# Patient Record
Sex: Female | Born: 1975 | Race: White | Hispanic: No | Marital: Married | Smoking: Never smoker
Health system: Southern US, Community
[De-identification: ages and names within clinical notes are randomized; demographics above are authoritative.]

## PROBLEM LIST (undated history)

## (undated) DIAGNOSIS — L409 Psoriasis, unspecified: Secondary | ICD-10-CM

## (undated) DIAGNOSIS — I10 Essential (primary) hypertension: Secondary | ICD-10-CM

## (undated) DIAGNOSIS — D219 Benign neoplasm of connective and other soft tissue, unspecified: Secondary | ICD-10-CM

## (undated) DIAGNOSIS — R87612 Low grade squamous intraepithelial lesion on cytologic smear of cervix (LGSIL): Secondary | ICD-10-CM

## (undated) DIAGNOSIS — E039 Hypothyroidism, unspecified: Secondary | ICD-10-CM

## (undated) HISTORY — DX: Benign neoplasm of connective and other soft tissue, unspecified: D21.9

## (undated) HISTORY — DX: Hypothyroidism, unspecified: E03.9

## (undated) HISTORY — DX: Low grade squamous intraepithelial lesion on cytologic smear of cervix (LGSIL): R87.612

## (undated) HISTORY — DX: Psoriasis, unspecified: L40.9

## (undated) HISTORY — PX: MYOMECTOMY: SHX85

## (undated) HISTORY — PX: AUGMENTATION MAMMAPLASTY: SUR837

## (undated) HISTORY — DX: Essential (primary) hypertension: I10

## (undated) HISTORY — PX: TONSILLECTOMY: SUR1361

## (undated) HISTORY — PX: COLPOSCOPY: SHX161

## (undated) HISTORY — PX: LASIK: SHX215

---

## 2008-04-03 ENCOUNTER — Ambulatory Visit: Payer: Self-pay

## 2008-04-03 ENCOUNTER — Ambulatory Visit: Payer: Self-pay | Admitting: Unknown Physician Specialty

## 2008-04-04 ENCOUNTER — Ambulatory Visit: Payer: Self-pay | Admitting: Unknown Physician Specialty

## 2008-04-06 ENCOUNTER — Ambulatory Visit: Payer: Self-pay | Admitting: Unknown Physician Specialty

## 2008-04-09 ENCOUNTER — Ambulatory Visit: Payer: Self-pay | Admitting: Unknown Physician Specialty

## 2008-04-12 ENCOUNTER — Ambulatory Visit: Payer: Self-pay | Admitting: Obstetrics and Gynecology

## 2008-04-15 ENCOUNTER — Ambulatory Visit: Payer: Self-pay | Admitting: Obstetrics and Gynecology

## 2009-05-20 ENCOUNTER — Encounter: Payer: Self-pay | Admitting: Obstetrics & Gynecology

## 2009-08-02 ENCOUNTER — Encounter: Payer: Self-pay | Admitting: Obstetrics & Gynecology

## 2009-08-05 ENCOUNTER — Observation Stay: Payer: Self-pay

## 2009-08-09 ENCOUNTER — Encounter: Payer: Self-pay | Admitting: Maternal and Fetal Medicine

## 2009-08-16 ENCOUNTER — Encounter: Payer: Self-pay | Admitting: Obstetrics & Gynecology

## 2009-08-23 ENCOUNTER — Observation Stay: Payer: Self-pay | Admitting: Obstetrics and Gynecology

## 2009-08-26 ENCOUNTER — Observation Stay: Payer: Self-pay

## 2009-08-30 ENCOUNTER — Encounter: Payer: Self-pay | Admitting: Maternal and Fetal Medicine

## 2009-09-06 ENCOUNTER — Encounter: Payer: Self-pay | Admitting: Obstetrics and Gynecology

## 2009-09-07 ENCOUNTER — Ambulatory Visit: Payer: Self-pay

## 2009-09-09 ENCOUNTER — Observation Stay: Payer: Self-pay

## 2009-09-13 ENCOUNTER — Encounter: Payer: Self-pay | Admitting: Maternal & Fetal Medicine

## 2009-09-15 ENCOUNTER — Observation Stay: Payer: Self-pay

## 2009-09-15 ENCOUNTER — Ambulatory Visit: Payer: Self-pay | Admitting: Unknown Physician Specialty

## 2009-09-16 ENCOUNTER — Inpatient Hospital Stay: Payer: Self-pay

## 2011-07-19 ENCOUNTER — Ambulatory Visit: Payer: Self-pay | Admitting: Obstetrics & Gynecology

## 2011-07-19 LAB — CBC WITH DIFFERENTIAL/PLATELET
Basophil #: 0 10*3/uL (ref 0.0–0.1)
Basophil %: 0.1 %
Eosinophil %: 0.5 %
HCT: 33.3 % — ABNORMAL LOW (ref 35.0–47.0)
HGB: 11.2 g/dL — ABNORMAL LOW (ref 12.0–16.0)
Lymphocyte #: 1.9 10*3/uL (ref 1.0–3.6)
Lymphocyte %: 22.5 %
MCH: 30.6 pg (ref 26.0–34.0)
Monocyte %: 10.4 %
Neutrophil #: 5.5 10*3/uL (ref 1.4–6.5)
Neutrophil %: 66.5 %
Platelet: 237 10*3/uL (ref 150–440)
RDW: 13.7 % (ref 11.5–14.5)
WBC: 8.3 10*3/uL (ref 3.6–11.0)

## 2011-07-20 ENCOUNTER — Inpatient Hospital Stay: Payer: Self-pay | Admitting: Obstetrics & Gynecology

## 2011-07-21 LAB — HEMATOCRIT: HCT: 28.1 % — ABNORMAL LOW (ref 35.0–47.0)

## 2012-08-01 ENCOUNTER — Ambulatory Visit: Payer: Self-pay | Admitting: Otolaryngology

## 2012-08-02 LAB — PATHOLOGY REPORT

## 2014-07-17 NOTE — Op Note (Signed)
PATIENT NAME:  Cassandra Rice, Cassandra Rice MR#:  [id number] DATE OF BIRTH:  [birth year]  DATE OF PROCEDURE:  08/01/2012  SURGEON:  Janalee Dane, MD  PREOPERATIVE DIAGNOSIS: Chronic tonsillitis.   POSTOPERATIVE DIAGNOSIS: Chronic tonsillitis.   PROCEDURE: Tonsillectomy.   ANESTHESIA:  General endotracheal. Total amount of local: 4 mL.  FINDINGS: The tonsils were 2+, chronically and cryptically inflamed.   OPERATIVE FINDINGS:  Large tonsils.  DESCRIPTION OF THE PROCEDURE:  The patient was identified in the holding area and taken to the operating room and placed in the supine position.  After general endotracheal anesthesia, the table was turned 45 degrees and the patient was draped in the usual fashion for a tonsillectomy.  A mouth gag was inserted into the oral cavity and examination of the oropharynx showed the uvula was non-bifid.  There was no evidence of submucous cleft to the palate.  There were large tonsils.  Beginning on the left-hand side a tenaculum was used to grasp the tonsil and the Bovie cautery was used to dissect it free from the fossa.  In a similar fashion, the right tonsil was removed.  Meticulous hemostasis was achieved using the Bovie cautery.  With both tonsils removed and no active bleeding, 0.5% plain Marcaine was used to inject the anterior and posterior tonsillar pillars bilaterally.  A total of 4 mL was used.  The patient tolerated the procedure well and was awakened in the operating room and taken to the recovery room in stable condition.   CULTURES:  None.  SPECIMENS:  Tonsils.  ESTIMATED BLOOD LOSS:  Less than 10 mL.      ____________________________ J. Nadeen Landau, MD jmc:dm D: 08/01/2012 07:43:06 ET T: 08/01/2012 09:03:05 ET JOB#: [id number]  cc: Janalee Dane, MD, <Dictator> Nicholos Johns MD ELECTRONICALLY SIGNED 08/13/2012 6:22

## 2014-07-19 NOTE — Op Note (Signed)
PATIENT NAME:  Cassandra Rice, Cassandra Rice MR#:  [id number] DATE OF BIRTH:  June 23, 1975  DATE OF PROCEDURE:  07/20/2011  PREOPERATIVE DIAGNOSIS: Term intrauterine pregnancy, prior history of cesarean section.   POSTOPERATIVE DIAGNOSIS: Term intrauterine pregnancy, prior history of cesarean section.   PROCEDURE: Low transverse cesarean section.   SURGEON: Barnett Applebaum, M.D.   Terrence DupontCharleston Poot, M.D.   ANESTHESIA: Spinal.   ESTIMATED BLOOD LOSS: 250 mL.   COMPLICATIONS: None.   FINDINGS: Normal tubes and ovaries. Uterus is normal other than a thin lower uterine segment. The patient delivered a viable female infant weighing 6 pounds, 12 ounces with Apgar scores of 9 and 9 at one and five minutes, respectively.   DISPOSITION: To the recovery room in stable condition.   TECHNIQUE: The patient is prepped and draped in the usual sterile fashion after adequate anesthesia is obtained in the supine position on the operating table. Scar is noted and a scalpel is used to create a low transverse skin incision down to the level of the rectus fascia. The rectus fascia is dissected bilaterally using Mayo scissors and the rectus muscles are separated in the midline. The peritoneum is penetrated and the bladder is inferiorly dissected and retracted. Scalpel is used to create a low transverse hysterotomy incision that is then extended by blunt dissection and then amniotomy reveals clear fluid. The infant's head is grasped and delivered easily without the use of a vacuum device. The oropharynx is suctioned. Nuchal cord is reduced and the remaining infant is delivered.   Cord blood is obtained and the placenta is manually extracted. The uterus is externalized and cleansed of all membranes and debris using a moist sponge. The hysterotomy incision is closed with a running #1 Vicryl suture in a locking fashion followed by a second layer to imbricate the first layer with excellent hemostasis noted. The uterus is then placed  back in the intraabdominal cavity and the paracolic gutters are irrigated using warm saline. Re-examination of the incision reveals excellent hemostasis.   The peritoneum is closed with a Vicryl suture. The On-Q pain pump is placed with trocars placed through the abdomen into the subfascial space and then the Silver soaker catheters are fed through these trocars into place. The rectus fascia is then closed with a 0 Maxon suture. The subcutaneous tissues are irrigated and hemostasis is assured using electrocautery. Skin is then closed with 4-0 Vicryl suture in a subcuticular fashion followed by placement of Dermabond to help close the incision as well as to secure the catheters in place. The catheters are infiltrated with bupivacaine 0.5% through each catheter and then Steri-Strips are used to stabilize them in place. The patient goes to the recovery room in stable condition. All sponge, instrument, and needle counts are correct.   ____________________________ R. Barnett Applebaum, MD rph:bjt D: 07/20/2011 08:21:03 ET T: 07/20/2011 10:42:36 ET JOB#: [id number]  cc: Glean Salen, MD, <Dictator> Gae Dry MD ELECTRONICALLY SIGNED 07/28/2011 10:28

## 2017-06-28 ENCOUNTER — Ambulatory Visit (INDEPENDENT_AMBULATORY_CARE_PROVIDER_SITE_OTHER): Payer: Managed Care, Other (non HMO) | Admitting: Obstetrics & Gynecology

## 2017-06-28 ENCOUNTER — Telehealth: Payer: Self-pay | Admitting: Obstetrics & Gynecology

## 2017-06-28 ENCOUNTER — Encounter: Payer: Self-pay | Admitting: Obstetrics & Gynecology

## 2017-06-28 VITALS — BP 110/60 | Ht 64.0 in | Wt 109.0 lb

## 2017-06-28 DIAGNOSIS — Z1231 Encounter for screening mammogram for malignant neoplasm of breast: Secondary | ICD-10-CM | POA: Diagnosis not present

## 2017-06-28 DIAGNOSIS — Z0000 Encounter for general adult medical examination without abnormal findings: Secondary | ICD-10-CM | POA: Diagnosis not present

## 2017-06-28 DIAGNOSIS — Z1239 Encounter for other screening for malignant neoplasm of breast: Secondary | ICD-10-CM

## 2017-06-28 DIAGNOSIS — Z1211 Encounter for screening for malignant neoplasm of colon: Secondary | ICD-10-CM

## 2017-06-28 DIAGNOSIS — Z124 Encounter for screening for malignant neoplasm of cervix: Secondary | ICD-10-CM | POA: Diagnosis not present

## 2017-06-28 NOTE — Progress Notes (Signed)
HPI:      Cassandra Rice is a 42 y.o. [id number] who LMP was Patient's last menstrual period was 06/01/2017., she presents today for her annual examination. The patient has no complaints today. The patient is sexually active. Her last pap: approximate date 2017 and was normal and last mammogram: approximate date 2014 (baseline) and was normal. The patient does perform self breast exams.  There is notable family history of breast or ovarian cancer in her family.  The patient has regular exercise: yes.  The patient denies current symptoms of depression.    GYN History: Contraception: IUD, 5 years, recent BTB (prior no bleeding for 5 years)  PMHx: Past Medical History:  Diagnosis Date  . Fibroids   . Hypertension   . Hypothyroidism   . LGSIL on Pap smear of cervix   . Psoriasis    Past Surgical History:  Procedure Laterality Date  . COLPOSCOPY    . LASIK    . MYOMECTOMY    . TONSILLECTOMY     Family History  Problem Relation Age of Onset  . Arthritis Mother   . Colon cancer Mother   . Hypertension Mother   . Skin cancer Mother   . Alcoholism Father   . Hypertension Father   . Breast cancer Maternal Aunt    Social History   Tobacco Use  . Smoking status: Never Smoker  . Smokeless tobacco: Never Used  Substance Use Topics  . Alcohol use: Yes  . Drug use: Never    Current Outpatient Medications:  .  OTEZLA 30 MG TABS, Take 1 tablet by mouth 2 (two) times daily., Disp: , Rfl: 11 .  SYNTHROID 50 MCG tablet, , Disp: , Rfl:  Allergies: Patient has no known allergies.  Review of Systems  Constitutional: Negative for chills, fever and malaise/fatigue.  HENT: Negative for congestion, sinus pain and sore throat.   Eyes: Negative for blurred vision and pain.  Respiratory: Negative for cough and wheezing.   Cardiovascular: Negative for chest pain and leg swelling.  Gastrointestinal: Negative for abdominal pain, constipation, diarrhea, heartburn, nausea and vomiting.    Genitourinary: Negative for dysuria, frequency, hematuria and urgency.  Musculoskeletal: Negative for back pain, joint pain, myalgias and neck pain.  Skin: Negative for itching and rash.  Neurological: Negative for dizziness, tremors and weakness.  Endo/Heme/Allergies: Does not bruise/bleed easily.  Psychiatric/Behavioral: Negative for depression. The patient is not nervous/anxious and does not have insomnia.    Objective: BP 110/60   Ht 5\' 4"  (1.626 m)   Wt 109 lb (49.4 kg)   LMP 06/01/2017   BMI 18.71 kg/m   Filed Weights   06/28/17 1002  Weight: 109 lb (49.4 kg)   Body mass index is 18.71 kg/m. Physical Exam  Constitutional: She is oriented to person, place, and time. She appears well-developed and well-nourished. No distress.  Genitourinary: Rectum normal, vagina normal and uterus normal. Pelvic exam was performed with patient supine. There is no rash or lesion on the right labia. There is no rash or lesion on the left labia. Vagina exhibits no lesion. No bleeding in the vagina. Right adnexum does not display mass and does not display tenderness. Left adnexum does not display mass and does not display tenderness. Cervix does not exhibit motion tenderness, lesion, friability or polyp.   Uterus is mobile and midaxial. Uterus is not enlarged or exhibiting a mass.  Genitourinary Comments: IUD strings 2 cm  HENT:  Head: Normocephalic and atraumatic. Head is  without laceration.  Right Ear: Hearing normal.  Left Ear: Hearing normal.  Nose: No epistaxis.  No foreign bodies.  Mouth/Throat: Uvula is midline, oropharynx is clear and moist and mucous membranes are normal.  Eyes: Pupils are equal, round, and reactive to light.  Neck: Normal range of motion. Neck supple. No thyromegaly present.  Cardiovascular: Normal rate and regular rhythm. Exam reveals no gallop and no friction rub.  No murmur heard. Pulmonary/Chest: Effort normal and breath sounds normal. No respiratory distress. She  has no wheezes. Right breast exhibits no mass, no skin change and no tenderness. Left breast exhibits no mass, no skin change and no tenderness.  Abdominal: Soft. Bowel sounds are normal. She exhibits no distension. There is no tenderness. There is no rebound.  Musculoskeletal: Normal range of motion.  Neurological: She is alert and oriented to person, place, and time. No cranial nerve deficit.  Skin: Skin is warm and dry.  Psychiatric: She has a normal mood and affect. Judgment normal.  Vitals reviewed.  Assessment:  ANNUAL EXAM 1. Annual physical exam   2. Screen for colon cancer   3. Screening for cervical cancer   4. Screening for breast cancer    Screening Plan:            1.  Cervical Screening-  Pap smear done today  2. Breast screening- Exam annually and mammogram>40 planned   3. Colonoscopy every 5 - 10 years based on FH  4. Labs managed by PCP  5. Counseling for contraception: Need for IUD exchange (has been 5 years).  Likes Mirena as has no periods and needs BC.    F/U  Return in about 4 days (around 07/02/2017) for IUD EXCHANGE.  Barnett Applebaum, MD, [facility], [facility] 06/28/2017  10:48 AM

## 2017-06-28 NOTE — Patient Instructions (Signed)
PAP every year Mammogram every year    Call [phone] to schedule at [facility] Colonoscopy every 5 or 10 years Labs yearly (with PCP)

## 2017-06-28 NOTE — Telephone Encounter (Signed)
Patient scheduled 4/10 for Mirena insert with [facility]

## 2017-06-29 LAB — PAP IG (IMAGE GUIDED): PAP Smear Comment: 0

## 2017-06-29 NOTE — Telephone Encounter (Signed)
Mirena reserved for this patient. 

## 2017-07-04 ENCOUNTER — Encounter: Payer: Self-pay | Admitting: Obstetrics & Gynecology

## 2017-07-04 ENCOUNTER — Ambulatory Visit (INDEPENDENT_AMBULATORY_CARE_PROVIDER_SITE_OTHER): Payer: Managed Care, Other (non HMO) | Admitting: Obstetrics & Gynecology

## 2017-07-04 VITALS — BP 100/60 | Ht 64.0 in | Wt 109.0 lb

## 2017-07-04 DIAGNOSIS — Z30433 Encounter for removal and reinsertion of intrauterine contraceptive device: Secondary | ICD-10-CM

## 2017-07-04 NOTE — Progress Notes (Signed)
  History of Present Illness:  Cassandra Rice is a 42 y.o. that had a Mirena IUD placed approximately 5 years ago. Since that time, she states that she has done well w Mirena w min periods/bleeding and reassurance as to contraception.  The following portions of the patient's history were reviewed and updated as appropriate: allergies, current medications, past family history, past medical history, past social history, past surgical history and problem list.  There are no active problems to display for this patient.  Medications:  Current Outpatient Medications on File Prior to Visit  Medication Sig Dispense Refill  . levonorgestrel (MIRENA) 20 MCG/24HR IUD 1 each by Intrauterine route once.    Marland Kitchen OTEZLA 30 MG TABS Take 1 tablet by mouth 2 (two) times daily.  11  . SYNTHROID 50 MCG tablet      No current facility-administered medications on file prior to visit.    Allergies: has No Known Allergies.  Physical Exam:  BP 100/60   Ht 5\' 4"  (1.626 m)   Wt 109 lb (49.4 kg)   BMI 18.71 kg/m  Body mass index is 18.71 kg/m. Constitutional: Well nourished, well developed female in no acute distress.  Abdomen: diffusely non tender to palpation, non distended, and no masses, hernias Neuro: Grossly intact Psych:  Normal mood and affect.    Pelvic exam:  Two IUD strings present seen coming from the cervical os. EGBUS, vaginal vault and cervix: within normal limits  IUD Removal Strings of IUD identified and grasped.  IUD removed without problem.  Pt tolerated this well.  IUD noted to be intact.  IUD Insertion  Patient identified, informed consent performed, consent signed.   Discussed risks of irregular bleeding, cramping, infection, malpositioning or misplacement of the IUD outside the uterus which may require further procedure such as laparoscopy, risk of failure <1%. Time out was performed.  Urine pregnancy test negative.  A bimanual exam showed the uterus to be midposition.  Speculum placed  in the vagina.  Cervix visualized.  Cleaned with Betadine x 2.  Grasped anteriorly with a single tooth tenaculum.  Uterus sounded to 6 cm.   IUD placed per manufacturer's recommendations.  Strings trimmed to 3 cm. Tenaculum was removed, good hemostasis noted.  Patient tolerated procedure well.   Patient was given post-procedure instructions.  She was advised to have backup contraception for one week.  Patient was also asked to check IUD strings periodically and follow up in 4 weeks for IUD check.  Assessment: IUD Removal and Re-Insertion for Birth Control and Treatment of Menorrhagia  Plan: Monitor for side effects of IUD insertion F/U One Month She was amenable to this plan.  Barnett Applebaum, M.D. 07/04/2017 4:33 PM

## 2017-07-04 NOTE — Patient Instructions (Signed)

## 2017-07-10 ENCOUNTER — Telehealth: Payer: Self-pay

## 2017-07-10 ENCOUNTER — Other Ambulatory Visit: Payer: Self-pay

## 2017-07-10 DIAGNOSIS — Z1211 Encounter for screening for malignant neoplasm of colon: Secondary | ICD-10-CM

## 2017-07-10 NOTE — Telephone Encounter (Signed)
Gastroenterology Pre-Procedure Review  Request Date: 08/29/17 Requesting Physician: Dr. Bonna Gains  PATIENT REVIEW QUESTIONS: The patient responded to the following health history questions as indicated:    1. Are you having any GI issues? no 2. Do you have a personal history of Polyps? no 3. Do you have a family history of Colon Cancer or Polyps? yes (mother colon cancer and colon polyps) 4. Diabetes Mellitus? no 5. Joint replacements in the past 12 months?no 6. Major health problems in the past 3 months?no 7. Any artificial heart valves, MVP, or defibrillator?no    MEDICATIONS & ALLERGIES:    Patient reports the following regarding taking any anticoagulation/antiplatelet therapy:   Plavix, Coumadin, Eliquis, Xarelto, Lovenox, Pradaxa, Brilinta, or Effient? no Aspirin? no  Patient confirms/reports the following medications:  Current Outpatient Medications  Medication Sig Dispense Refill  . levonorgestrel (MIRENA) 20 MCG/24HR IUD 1 each by Intrauterine route once.    Marland Kitchen OTEZLA 30 MG TABS Take 1 tablet by mouth 2 (two) times daily.  11  . SYNTHROID 50 MCG tablet      No current facility-administered medications for this visit.     Patient confirms/reports the following allergies:  No Known Allergies  No orders of the defined types were placed in this encounter.   AUTHORIZATION INFORMATION Primary Insurance: 1D#: Group #:  Secondary Insurance: 1D#: Group #:  SCHEDULE INFORMATION: Date: 08/29/17 Time: Location:[facility]

## 2017-07-11 NOTE — Telephone Encounter (Signed)
Mirena received/charged 07/04/17

## 2017-08-01 ENCOUNTER — Encounter: Payer: Self-pay | Admitting: Obstetrics & Gynecology

## 2017-08-01 ENCOUNTER — Ambulatory Visit (INDEPENDENT_AMBULATORY_CARE_PROVIDER_SITE_OTHER): Payer: Managed Care, Other (non HMO) | Admitting: Obstetrics & Gynecology

## 2017-08-01 VITALS — BP 110/60 | Ht 64.0 in | Wt 110.0 lb

## 2017-08-01 DIAGNOSIS — Z30431 Encounter for routine checking of intrauterine contraceptive device: Secondary | ICD-10-CM

## 2017-08-01 NOTE — Progress Notes (Signed)
  History of Present Illness:  Cassandra Rice is a 42 y.o. that had a Mirena IUD placed approximately 4 weeks ago. Since that time, she states that she has had no pain but does have spotty bleeding most days (this happened at start of last new IUD placement as well).  PMHx: She  has a past medical history of Fibroids, Hypertension, Hypothyroidism, LGSIL on Pap smear of cervix, and Psoriasis. Also,  has a past surgical history that includes Colposcopy; LASIK; Myomectomy; and Tonsillectomy., family history includes Alcoholism in her father; Arthritis in her mother; Breast cancer in her maternal aunt; Colon cancer in her mother; Hypertension in her father and mother; Skin cancer in her mother.,  reports that she has never smoked. She has never used smokeless tobacco. She reports that she drinks alcohol. She reports that she does not use drugs. No outpatient medications have been marked as taking for the 08/01/17 encounter (Office Visit) with Gae Dry, MD.  .  Also, has No Known Allergies..  Review of Systems  All other systems reviewed and are negative.  Physical Exam:  BP 110/60   Ht 5\' 4"  (1.626 m)   Wt 110 lb (49.9 kg)   BMI 18.88 kg/m  Body mass index is 18.88 kg/m. Constitutional: Well nourished, well developed female in no acute distress.  Abdomen: diffusely non tender to palpation, non distended, and no masses, hernias Neuro: Grossly intact Psych:  Normal mood and affect.    Pelvic exam:  Two IUD strings present seen coming from the cervical os. EGBUS, vaginal vault and cervix: within normal limits  Assessment: IUD strings present in proper location; pt doing well  Plan: She was told to continue to use barrier contraception, in order to prevent any STIs, and to take a home pregnancy test or call us if she ever thinks she may be pregnant, and that her IUD expires in 5 years.  OCP to overlap and suppress BTB discussed and sample Rx given  She was amenable to this plan and we  will see her back in 1 year/PRN.  A total of 15 minutes were spent face-to-face with the patient during this encounter and over half of that time dealt with counseling and coordination of care.  Barnett Applebaum, MD, [facility], [facility] 08/01/2017  4:20 PM

## 2017-08-13 ENCOUNTER — Telehealth: Payer: Self-pay | Admitting: Gastroenterology

## 2017-08-13 NOTE — Telephone Encounter (Signed)
Patient Cassandra Rice that her insurance will not cover her colonoscopy at this time due to her age. She stated that her mother had cancerous polyps removed and was told her children would need a colonoscopy at an early age. Please call patient to get more information so this might be covered for her. She also would like to have her mothers history noted in her chart.

## 2017-08-16 DIAGNOSIS — Z9889 Other specified postprocedural states: Secondary | ICD-10-CM | POA: Insufficient documentation

## 2017-08-16 DIAGNOSIS — Z9089 Acquired absence of other organs: Secondary | ICD-10-CM | POA: Insufficient documentation

## 2017-08-16 DIAGNOSIS — Z800 Family history of malignant neoplasm of digestive organs: Secondary | ICD-10-CM | POA: Insufficient documentation

## 2017-08-16 DIAGNOSIS — Z803 Family history of malignant neoplasm of breast: Secondary | ICD-10-CM | POA: Insufficient documentation

## 2017-08-16 DIAGNOSIS — Z808 Family history of malignant neoplasm of other organs or systems: Secondary | ICD-10-CM | POA: Insufficient documentation

## 2017-08-16 NOTE — Telephone Encounter (Signed)
Contacted pt's insurance company.  I've been instructed to submit documentation for pre-determination.  Sending documents today.

## 2017-08-23 ENCOUNTER — Telehealth: Payer: Self-pay

## 2017-08-23 NOTE — Telephone Encounter (Signed)
Advised patient of insurance response to pre-determination request:  [employer] Call Ref # [id number] Determination: eligible for coverage Z80.0: family hx of colon cancer [phone] = colonoscopy

## 2017-08-29 ENCOUNTER — Encounter
Admission: RE | Disposition: A | Discharge status: Home / Self Care | Payer: Self-pay | Source: Ambulatory Visit | Attending: Gastroenterology

## 2017-08-29 ENCOUNTER — Ambulatory Visit: Payer: Managed Care, Other (non HMO) | Admitting: Certified Registered Nurse Anesthetist

## 2017-08-29 ENCOUNTER — Ambulatory Visit
Admission: RE | Admit: 2017-08-29 | Discharge: 2017-08-29 | Disposition: A | Discharge status: Home / Self Care | Payer: Managed Care, Other (non HMO) | Source: Ambulatory Visit | Attending: Gastroenterology | Admitting: Gastroenterology

## 2017-08-29 ENCOUNTER — Encounter: Payer: Self-pay | Admitting: Anesthesiology

## 2017-08-29 DIAGNOSIS — D125 Benign neoplasm of sigmoid colon: Secondary | ICD-10-CM | POA: Diagnosis not present

## 2017-08-29 DIAGNOSIS — Z79890 Hormone replacement therapy (postmenopausal): Secondary | ICD-10-CM | POA: Diagnosis not present

## 2017-08-29 DIAGNOSIS — K635 Polyp of colon: Secondary | ICD-10-CM

## 2017-08-29 DIAGNOSIS — E039 Hypothyroidism, unspecified: Secondary | ICD-10-CM | POA: Diagnosis not present

## 2017-08-29 DIAGNOSIS — K5730 Diverticulosis of large intestine without perforation or abscess without bleeding: Secondary | ICD-10-CM

## 2017-08-29 DIAGNOSIS — I10 Essential (primary) hypertension: Secondary | ICD-10-CM | POA: Diagnosis not present

## 2017-08-29 DIAGNOSIS — Z79899 Other long term (current) drug therapy: Secondary | ICD-10-CM | POA: Diagnosis not present

## 2017-08-29 DIAGNOSIS — Z975 Presence of (intrauterine) contraceptive device: Secondary | ICD-10-CM | POA: Insufficient documentation

## 2017-08-29 DIAGNOSIS — Z1211 Encounter for screening for malignant neoplasm of colon: Secondary | ICD-10-CM

## 2017-08-29 DIAGNOSIS — L409 Psoriasis, unspecified: Secondary | ICD-10-CM | POA: Insufficient documentation

## 2017-08-29 DIAGNOSIS — Z800 Family history of malignant neoplasm of digestive organs: Secondary | ICD-10-CM

## 2017-08-29 HISTORY — PX: COLONOSCOPY WITH PROPOFOL: SHX5780

## 2017-08-29 LAB — POCT PREGNANCY, URINE: Preg Test, Ur: NEGATIVE

## 2017-08-29 LAB — HM COLONOSCOPY

## 2017-08-29 SURGERY — COLONOSCOPY WITH PROPOFOL
Anesthesia: General

## 2017-08-29 MED ORDER — GLYCOPYRROLATE 0.2 MG/ML IJ SOLN
INTRAMUSCULAR | Status: AC
  Filled 2017-08-29: qty 1

## 2017-08-29 MED ORDER — PROPOFOL 500 MG/50ML IV EMUL
INTRAVENOUS | Status: AC
  Filled 2017-08-29: qty 50

## 2017-08-29 MED ORDER — MIDAZOLAM HCL 2 MG/2ML IJ SOLN
INTRAMUSCULAR | Status: AC
  Filled 2017-08-29: qty 2

## 2017-08-29 MED ORDER — MIDAZOLAM HCL 2 MG/2ML IJ SOLN
INTRAMUSCULAR | Status: DC | PRN
  Administered 2017-08-29: 2 mg via INTRAVENOUS

## 2017-08-29 MED ORDER — SUCCINYLCHOLINE CHLORIDE 20 MG/ML IJ SOLN
INTRAMUSCULAR | Status: AC
  Filled 2017-08-29: qty 1

## 2017-08-29 MED ORDER — LIDOCAINE HCL (PF) 1 % IJ SOLN: 2.0000 mL | Freq: Once | INTRAMUSCULAR | Status: DC

## 2017-08-29 MED ORDER — PROPOFOL 500 MG/50ML IV EMUL
INTRAVENOUS | Status: DC | PRN
  Administered 2017-08-29: 150 ug/kg/min via INTRAVENOUS

## 2017-08-29 MED ORDER — LIDOCAINE HCL (PF) 1 % IJ SOLN
INTRAMUSCULAR | Status: AC
  Filled 2017-08-29: qty 2

## 2017-08-29 MED ORDER — PROPOFOL 10 MG/ML IV BOLUS
INTRAVENOUS | Status: DC | PRN
  Administered 2017-08-29: 20 mg via INTRAVENOUS
  Administered 2017-08-29: 60 mg via INTRAVENOUS
  Administered 2017-08-29 (×6): 20 mg via INTRAVENOUS

## 2017-08-29 MED ORDER — SODIUM CHLORIDE 0.9 % IV SOLN
INTRAVENOUS | Status: DC
  Administered 2017-08-29: 08:00:00 via INTRAVENOUS

## 2017-08-29 NOTE — Anesthesia Procedure Notes (Signed)
Performed by: Amine Adelson, CRNA Pre-anesthesia Checklist: Emergency Drugs available, Patient identified, Suction available, Patient being monitored and Timeout performed Patient Re-evaluated:Patient Re-evaluated prior to induction Oxygen Delivery Method: Nasal cannula Induction Type: IV induction       

## 2017-08-29 NOTE — Anesthesia Postprocedure Evaluation (Signed)
Anesthesia Post Note  Patient: Cassandra Rice  Procedure(s) Performed: COLONOSCOPY WITH PROPOFOL (N/A )  Patient location during evaluation: Endoscopy Anesthesia Type: General Level of consciousness: awake and alert Pain management: pain level controlled Vital Signs Assessment: post-procedure vital signs reviewed and stable Respiratory status: spontaneous breathing, nonlabored ventilation, respiratory function stable and patient connected to nasal cannula oxygen Cardiovascular status: blood pressure returned to baseline and stable Postop Assessment: no apparent nausea or vomiting Anesthetic complications: no     Last Vitals:  Vitals:   08/29/17 0910 08/29/17 0920  BP: (!) 93/46 108/89  Pulse: (!) 53 60  Resp: 19 13  Temp:    SpO2: 100% 100%    Last Pain:  Vitals:   08/29/17 0920  TempSrc:   PainSc: 0-No pain                 Martha Clan

## 2017-08-29 NOTE — H&P (Signed)
Vonda Antigua, MD [address] [place], [place], [place], [address] [address], [place], [place], [place], [address] Phone: [phone]  Fax: [phone]  Primary Care Physician:  Gae Dry, MD   Pre-Procedure History & Physical: HPI:  Cassandra Rice is a 42 y.o. female is here for a colonoscopy.   Past Medical History:  Diagnosis Date  . Fibroids   . Hypertension   . Hypothyroidism   . LGSIL on Pap smear of cervix   . Psoriasis     Past Surgical History:  Procedure Laterality Date  . COLPOSCOPY    . LASIK    . MYOMECTOMY    . TONSILLECTOMY      Prior to Admission medications   Medication Sig Start Date End Date Taking? Authorizing Provider  levonorgestrel (MIRENA) 20 MCG/24HR IUD 1 each by Intrauterine route once.    [provider]  OTEZLA 30 MG TABS Take 1 tablet by mouth 2 (two) times daily. 05/31/17   [provider]  SYNTHROID 50 MCG tablet  06/19/17   [provider]    Allergies as of 07/10/2017  . (No Known Allergies)    Family History  Problem Relation Age of Onset  . Arthritis Mother   . Colon cancer Mother   . Hypertension Mother   . Skin cancer Mother   . Alcoholism Father   . Hypertension Father   . Breast cancer Maternal Aunt     Social History   Socioeconomic History  . Marital status: Married    Spouse name: Not on file  . Number of children: Not on file  . Years of education: Not on file  . Highest education level: Not on file  Occupational History  . Not on file  Social Needs  . Financial resource strain: Not on file  . Food insecurity:    Worry: Not on file    Inability: Not on file  . Transportation needs:    Medical: Not on file    Non-medical: Not on file  Tobacco Use  . Smoking status: Never Smoker  . Smokeless tobacco: Never Used  Substance and Sexual Activity  . Alcohol use: Yes  . Drug use: Never  . Sexual activity: Yes    Birth control/protection: IUD  Lifestyle  .  Physical activity:    Days per week: Not on file    Minutes per session: Not on file  . Stress: Not on file  Relationships  . Social connections:    Talks on phone: Not on file    Gets together: Not on file    Attends religious service: Not on file    Active member of club or organization: Not on file    Attends meetings of clubs or organizations: Not on file    Relationship status: Not on file  . Intimate partner violence:    Fear of current or ex partner: Not on file    Emotionally abused: Not on file    Physically abused: Not on file    Forced sexual activity: Not on file  Other Topics Concern  . Not on file  Social History Narrative  . Not on file    Review of Systems: See HPI, otherwise negative ROS  Physical Exam: BP 124/65   Pulse 70   Temp 97.8 F (36.6 C) (Tympanic)   Resp 17   Ht 5\' 4"  (1.626 m)   Wt 107 lb (48.5 kg)   SpO2 100%   BMI 18.37 kg/m  General:   Alert,  pleasant and cooperative in NAD Head:  Normocephalic and atraumatic. Neck:  Supple; no masses or thyromegaly. Lungs:  Clear throughout to auscultation, normal respiratory effort.    Heart:  +S1, +S2, Regular rate and rhythm, No edema. Abdomen:  Soft, nontender and nondistended. Normal bowel sounds, without guarding, and without rebound.   Neurologic:  Alert and  oriented x4;  grossly normal neurologically.  Impression/Plan: Cassandra Rice is here for a colonoscopy to be performed for high risk screening. Mother diagnosed with colon cancer in her 34s.   Risks, benefits, limitations, and alternatives regarding  colonoscopy have been reviewed with the patient.  Questions have been answered.  All parties agreeable.   Virgel Manifold, MD  08/29/2017, 8:07 AM

## 2017-08-29 NOTE — Anesthesia Preprocedure Evaluation (Signed)
Anesthesia Evaluation  Patient identified by MRN, date of birth, ID band Patient awake    Reviewed: Allergy & Precautions, H&P , NPO status , Patient's Chart, lab work & pertinent test results, reviewed documented beta blocker date and time   History of Anesthesia Complications (+) PONV and history of anesthetic complications  Airway Mallampati: I  TM Distance: >3 FB Neck ROM: full    Dental  (+) Dental Advidsory Given, Teeth Intact   Pulmonary neg pulmonary ROS,           Cardiovascular Exercise Tolerance: Good negative cardio ROS       Neuro/Psych negative neurological ROS  negative psych ROS   GI/Hepatic negative GI ROS, Neg liver ROS,   Endo/Other  neg diabetesHypothyroidism   Renal/GU negative Renal ROS  negative genitourinary   Musculoskeletal   Abdominal   Peds  Hematology negative hematology ROS (+)   Anesthesia Other Findings Past Medical History: No date: Fibroids No date: Hypertension No date: Hypothyroidism No date: LGSIL on Pap smear of cervix No date: Psoriasis   Reproductive/Obstetrics negative OB ROS                             Anesthesia Physical Anesthesia Plan  ASA: II  Anesthesia Plan: General   Post-op Pain Management:    Induction: Intravenous  PONV Risk Score and Plan: 3 and Propofol infusion  Airway Management Planned: Nasal Cannula  Additional Equipment:   Intra-op Plan:   Post-operative Plan:   Informed Consent: I have reviewed the patients History and Physical, chart, labs and discussed the procedure including the risks, benefits and alternatives for the proposed anesthesia with the patient or authorized representative who has indicated his/her understanding and acceptance.   Dental Advisory Given  Plan Discussed with: Anesthesiologist, CRNA and Surgeon  Anesthesia Plan Comments:         Anesthesia Quick Evaluation

## 2017-08-29 NOTE — Anesthesia Post-op Follow-up Note (Signed)
Anesthesia QCDR form completed.        

## 2017-08-29 NOTE — Transfer of Care (Signed)
Immediate Anesthesia Transfer of Care Note  Patient: Cassandra Rice  Procedure(s) Performed: COLONOSCOPY WITH PROPOFOL (N/A )  Patient Location: PACU  Anesthesia Type:General  Level of Consciousness: sedated  Airway & Oxygen Therapy: Patient Spontanous Breathing and Patient connected to nasal cannula oxygen  Post-op Assessment: Report given to RN and Post -op Vital signs reviewed and stable  Post vital signs: Reviewed and stable  Last Vitals:  Vitals Value Taken Time  BP    Temp    Pulse 64 08/29/2017  9:02 AM  Resp 18 08/29/2017  9:02 AM  SpO2 100 % 08/29/2017  9:02 AM  Vitals shown include unvalidated device data.  Last Pain:  Vitals:   08/29/17 0738  TempSrc: Tympanic         Complications: No apparent anesthesia complications

## 2017-08-29 NOTE — Op Note (Signed)
[facility] Gastroenterology Patient Name: Cassandra Rice Procedure Date: 08/29/2017 7:20 AM MRN: [id number] Account #: [id number] Date of Birth: 1975/12/15 Admit Type: Outpatient Age: 42 Room: [facility] ENDO ROOM 2 Gender: Female Note Status: Finalized Procedure:            Colonoscopy Indications:          Screening in patient at increased risk: Family history                        of 1st-degree relative with colorectal cancer before                        age 76 years Providers:            Ellery Tash B. Bonna Gains MD, MD Referring MD:         Gae Dry, MD (Referring MD) Medicines:            Monitored Anesthesia Care Complications:        No immediate complications. Procedure:            Pre-Anesthesia Assessment:                       - ASA Grade Assessment: II - A patient with mild                        systemic disease.                       - Prior to the procedure, a History and Physical was                        performed, and patient medications, allergies and                        sensitivities were reviewed. The patient's tolerance of                        previous anesthesia was reviewed.                       - The risks and benefits of the procedure and the                        sedation options and risks were discussed with the                        patient. All questions were answered and informed                        consent was obtained.                       - Patient identification and proposed procedure were                        verified prior to the procedure by the physician, the                        nurse, the anesthesiologist, the anesthetist and the  technician. The procedure was verified in the procedure                        room.                       After obtaining informed consent, the colonoscope was                        passed under direct vision. Throughout the procedure,   the patient's blood pressure, pulse, and oxygen                        saturations were monitored continuously. The                        Colonoscope was introduced through the anus and                        advanced to the the cecum, identified by appendiceal                        orifice and ileocecal valve. The colonoscopy was                        performed with ease. The patient tolerated the                        procedure well. The quality of the bowel preparation                        was good. Findings:      The perianal and digital rectal examinations were normal.      A 6 mm polyp was found in the sigmoid colon. The polyp was sessile. The       polyp was removed with a hot snare. Resection and retrieval were       complete.      A few diverticula were found in the sigmoid colon.      The exam was otherwise without abnormality.      The rectum, sigmoid colon, descending colon, transverse colon, ascending       colon and cecum appeared normal.      The retroflexed view of the distal rectum and anal verge was normal and       showed no anal or rectal abnormalities. Impression:           - One 6 mm polyp in the sigmoid colon, removed with a                        hot snare. Resected and retrieved.                       - Diverticulosis in the sigmoid colon.                       - The examination was otherwise normal.                       - The rectum, sigmoid colon, descending colon,  transverse colon, ascending colon and cecum are normal.                       - The distal rectum and anal verge are normal on                        retroflexion view. Recommendation:       - Discharge patient to home (with escort).                       - Advance diet as tolerated.                       - Continue present medications.                       - Await pathology results.                       - Repeat colonoscopy in 5 years for surveillance.                        - The findings and recommendations were discussed with                        the patient.                       - The findings and recommendations were discussed with                        the patient's family.                       - Return to primary care physician as previously                        scheduled.                       - High fiber diet. Procedure Code(s):    --- Professional ---                       [phone], Colonoscopy, flexible; with removal of tumor(s),                        polyp(s), or other lesion(s) by snare technique Diagnosis Code(s):    --- Professional ---                       Z80.0, Family history of malignant neoplasm of                        digestive organs                       D12.5, Benign neoplasm of sigmoid colon                       K57.30, Diverticulosis of large intestine without                        perforation or abscess without bleeding CPT copyright 2017 American Medical Association. All  rights reserved. The codes documented in this report are preliminary and upon coder review may  be revised to meet current compliance requirements.  Vonda Antigua, MD Margretta Sidle B. Bonna Gains MD, MD 08/29/2017 9:00:41 AM This report has been signed electronically. Number of Addenda: 0 Note Initiated On: 08/29/2017 7:20 AM Scope Withdrawal Time: 0 hours 27 minutes 31 seconds  Total Procedure Duration: 0 hours 43 minutes 30 seconds  Estimated Blood Loss: Estimated blood loss: none.      [facility]

## 2017-08-30 ENCOUNTER — Encounter: Payer: Self-pay | Admitting: Gastroenterology

## 2017-08-30 LAB — SURGICAL PATHOLOGY

## 2017-09-11 ENCOUNTER — Encounter: Payer: Self-pay | Admitting: Gastroenterology

## 2017-10-22 ENCOUNTER — Telehealth: Payer: Self-pay

## 2017-10-22 NOTE — Telephone Encounter (Signed)
-----   Message from Gae Dry, MD sent at 10/02/2017 10:42 AM EDT ----- Regarding: MMG needed Received notice she has not received MMG yet as ordered at her Annual. Please check and encourage her to do this, and document conversation.

## 2017-10-22 NOTE — Telephone Encounter (Signed)
Left detailed message for pt to schedule appointment for mammo.

## 2017-10-29 ENCOUNTER — Encounter: Payer: Self-pay | Admitting: Gastroenterology

## 2017-12-17 ENCOUNTER — Telehealth: Payer: Self-pay

## 2017-12-17 NOTE — Telephone Encounter (Signed)
Copied from [place] [phone]. Topic: Appointment Scheduling - Scheduling Inquiry for Clinic >> Dec 17, 2017 11:32 AM Rutherford Nail, NT wrote: Reason for CRM: Calling to speak with Caryl Pina about getting scheduled with Dr Nicki Reaper. States that her mother is a current patient of Dr Nicki Reaper. Please advise.

## 2017-12-17 NOTE — Telephone Encounter (Signed)
Is it ok  To schedule

## 2017-12-18 NOTE — Telephone Encounter (Signed)
Ok. See me before calling.

## 2017-12-24 NOTE — Telephone Encounter (Signed)
Lm for pt to call back

## 2017-12-24 NOTE — Telephone Encounter (Signed)
To get more info

## 2017-12-25 ENCOUNTER — Other Ambulatory Visit: Payer: Self-pay | Admitting: Obstetrics & Gynecology

## 2017-12-25 ENCOUNTER — Ambulatory Visit
Admission: RE | Admit: 2017-12-25 | Discharge: 2017-12-25 | Disposition: A | Discharge status: Home / Self Care | Payer: Managed Care, Other (non HMO) | Source: Ambulatory Visit | Attending: Obstetrics & Gynecology | Admitting: Obstetrics & Gynecology

## 2017-12-25 DIAGNOSIS — Z1239 Encounter for other screening for malignant neoplasm of breast: Secondary | ICD-10-CM | POA: Insufficient documentation

## 2017-12-31 ENCOUNTER — Inpatient Hospital Stay
Admission: RE | Admit: 2017-12-31 | Discharge: 2017-12-31 | Disposition: A | Discharge status: Home / Self Care | Payer: Self-pay | Source: Ambulatory Visit | Attending: *Deleted | Admitting: *Deleted

## 2017-12-31 ENCOUNTER — Other Ambulatory Visit: Payer: Self-pay | Admitting: *Deleted

## 2017-12-31 DIAGNOSIS — Z9289 Personal history of other medical treatment: Secondary | ICD-10-CM

## 2019-06-05 ENCOUNTER — Ambulatory Visit (INDEPENDENT_AMBULATORY_CARE_PROVIDER_SITE_OTHER): Payer: 59 | Admitting: Obstetrics & Gynecology

## 2019-06-05 ENCOUNTER — Other Ambulatory Visit: Payer: Self-pay

## 2019-06-05 ENCOUNTER — Encounter: Payer: Self-pay | Admitting: Obstetrics & Gynecology

## 2019-06-05 VITALS — BP 122/82 | Ht 64.0 in | Wt 111.0 lb

## 2019-06-05 DIAGNOSIS — Z13220 Encounter for screening for lipoid disorders: Secondary | ICD-10-CM

## 2019-06-05 DIAGNOSIS — Z1321 Encounter for screening for nutritional disorder: Secondary | ICD-10-CM

## 2019-06-05 DIAGNOSIS — Z01419 Encounter for gynecological examination (general) (routine) without abnormal findings: Secondary | ICD-10-CM

## 2019-06-05 DIAGNOSIS — Z1329 Encounter for screening for other suspected endocrine disorder: Secondary | ICD-10-CM

## 2019-06-05 DIAGNOSIS — Z975 Presence of (intrauterine) contraceptive device: Secondary | ICD-10-CM | POA: Diagnosis not present

## 2019-06-05 DIAGNOSIS — Z1231 Encounter for screening mammogram for malignant neoplasm of breast: Secondary | ICD-10-CM

## 2019-06-05 DIAGNOSIS — Z131 Encounter for screening for diabetes mellitus: Secondary | ICD-10-CM

## 2019-06-05 NOTE — Patient Instructions (Addendum)
PAP every three years Mammogram every year    Call [phone] to schedule at [facility]

## 2019-06-05 NOTE — Progress Notes (Signed)
HPI:      [name] is a 44 y.o. [phone] who LMP was No LMP recorded. (Menstrual status: IUD)., she presents today for her annual examination. The patient has no complaints today. The patient is sexually active. Her last pap: approximate date 2019 and was normal and last mammogram: approximate date 2018 and was normal. The patient does perform self breast exams.  There is no notable family history of breast or ovarian cancer in her family.  The patient has regular exercise: yes.  The patient denies current symptoms of depression.    GYN History: Contraception: IUD Mirena yr 2 Rare spotting  PMHx: Past Medical History:  Diagnosis Date  . Fibroids   . Hypertension   . Hypothyroidism   . LGSIL on Pap smear of cervix   . Psoriasis    Past Surgical History:  Procedure Laterality Date  . AUGMENTATION MAMMAPLASTY Bilateral    15 years ago  . COLONOSCOPY WITH PROPOFOL N/A 08/29/2017   Procedure: COLONOSCOPY WITH PROPOFOL;  Surgeon: Virgel Manifold, MD;  Location: [facility] ENDOSCOPY;  Service: Endoscopy;  Laterality: N/A;  . COLPOSCOPY    . LASIK    . MYOMECTOMY    . TONSILLECTOMY     Family History  Problem Relation Age of Onset  . Arthritis Mother   . Colon cancer Mother   . Hypertension Mother   . Skin cancer Mother   . Alcoholism Father   . Hypertension Father   . Breast cancer Maternal Aunt    Social History   Tobacco Use  . Smoking status: Never Smoker  . Smokeless tobacco: Never Used  Substance Use Topics  . Alcohol use: Yes  . Drug use: Never    Current Outpatient Medications:  .  levonorgestrel (MIRENA) 20 MCG/24HR IUD, 1 each by Intrauterine route once., Disp: , Rfl:  .  OTEZLA 30 MG TABS, Take 1 tablet by mouth 2 (two) times daily., Disp: , Rfl: 11 .  SYNTHROID 50 MCG tablet, , Disp: , Rfl:  Allergies: Patient has no known allergies.  Review of Systems  Constitutional: Negative for chills, fever and malaise/fatigue.  HENT: Negative for congestion,  sinus pain and sore throat.   Eyes: Negative for blurred vision and pain.  Respiratory: Negative for cough and wheezing.   Cardiovascular: Negative for chest pain and leg swelling.  Gastrointestinal: Negative for abdominal pain, constipation, diarrhea, heartburn, nausea and vomiting.  Genitourinary: Negative for dysuria, frequency, hematuria and urgency.  Musculoskeletal: Negative for back pain, joint pain, myalgias and neck pain.  Skin: Negative for itching and rash.  Neurological: Negative for dizziness, tremors and weakness.  Endo/Heme/Allergies: Does not bruise/bleed easily.  Psychiatric/Behavioral: Negative for depression. The patient is not nervous/anxious and does not have insomnia.     Objective: BP 122/82   Ht 5\' 4"  (1.626 m)   Wt 111 lb (50.3 kg)   BMI 19.05 kg/m   Filed Weights   06/05/19 0956  Weight: 111 lb (50.3 kg)   Body mass index is 19.05 kg/m. Physical Exam Constitutional:      General: She is not in acute distress.    Appearance: She is well-developed.  Genitourinary:     Pelvic exam was performed with patient supine.     Bladder, vagina, uterus and rectum normal.     No lesions in the vagina.     No vaginal bleeding.     No cervical motion tenderness, friability, lesion or polyp.     IUD strings  visualized.     Uterus is mobile.     Uterus is not enlarged.     No uterine mass detected.    Uterus is midaxial.     No right or left adnexal mass present.     Right adnexa not tender.     Left adnexa not tender.  HENT:     Head: Normocephalic and atraumatic. No laceration.     Right Ear: Hearing normal.     Left Ear: Hearing normal.     Mouth/Throat:     Pharynx: Uvula midline.  Eyes:     Pupils: Pupils are equal, round, and reactive to light.  Neck:     Thyroid: No thyromegaly.  Cardiovascular:     Rate and Rhythm: Normal rate and regular rhythm.     Heart sounds: No murmur. No friction rub. No gallop.   Pulmonary:     Effort: Pulmonary effort  is normal. No respiratory distress.     Breath sounds: Normal breath sounds. No wheezing.  Chest:     Breasts:        Right: No mass, skin change or tenderness.        Left: No mass, skin change or tenderness.  Abdominal:     General: Bowel sounds are normal. There is no distension.     Palpations: Abdomen is soft.     Tenderness: There is no abdominal tenderness. There is no rebound.  Musculoskeletal:        General: Normal range of motion.     Cervical back: Normal range of motion and neck supple.  Neurological:     Mental Status: She is alert and oriented to person, place, and time.     Cranial Nerves: No cranial nerve deficit.  Skin:    General: Skin is warm and dry.  Psychiatric:        Judgment: Judgment normal.  Vitals reviewed.     Assessment:  ANNUAL EXAM 1. Women's annual routine gynecological examination   2. Encounter for screening mammogram for malignant neoplasm of breast   3. Screening for cholesterol level   4. Screening for diabetes mellitus   5. Screening for thyroid disorder   6. Encounter for vitamin deficiency screening      Screening Plan:            1.  Cervical Screening-  Pap smear schedule reviewed with patient  2. Breast screening- Exam annually and mammogram>40 planned   3. Colonoscopy every 5 years, due age 3 (polyp at last colonoscopy 2 yrs ago)  4. Labs To return fasting at a later date  5. Counseling for contraception: IUD (Mirena yr 2)    F/U  Return in about 1 year (around 06/04/2020) for Annual, ALSO appt for fasting labs soon.  Barnett Applebaum, MD, [facility], [facility] 06/05/2019  10:31 AM

## 2019-06-13 ENCOUNTER — Other Ambulatory Visit: Payer: 59

## 2019-06-13 ENCOUNTER — Other Ambulatory Visit: Payer: Self-pay

## 2019-06-13 DIAGNOSIS — Z13220 Encounter for screening for lipoid disorders: Secondary | ICD-10-CM

## 2019-06-13 DIAGNOSIS — Z1321 Encounter for screening for nutritional disorder: Secondary | ICD-10-CM

## 2019-06-13 DIAGNOSIS — Z131 Encounter for screening for diabetes mellitus: Secondary | ICD-10-CM

## 2019-06-13 DIAGNOSIS — Z1329 Encounter for screening for other suspected endocrine disorder: Secondary | ICD-10-CM

## 2019-06-14 LAB — VITAMIN D 25 HYDROXY (VIT D DEFICIENCY, FRACTURES): Vit D, 25-Hydroxy: 58.5 ng/mL (ref 30.0–100.0)

## 2019-06-14 LAB — LIPID PANEL
Chol/HDL Ratio: 2 ratio (ref 0.0–4.4)
Cholesterol, Total: 192 mg/dL (ref 100–199)
HDL: 94 mg/dL (ref >39)
LDL Chol Calc (NIH): 86 mg/dL (ref 0–99)
Triglycerides: 64 mg/dL (ref 0–149)
VLDL Cholesterol Cal: 12 mg/dL (ref 5–40)

## 2019-06-14 LAB — TSH: TSH: 2.3 u[IU]/mL (ref 0.450–4.500)

## 2019-06-14 LAB — GLUCOSE, FASTING: Glucose, Plasma: 86 mg/dL (ref 65–99)

## 2019-09-10 ENCOUNTER — Telehealth: Payer: Self-pay

## 2019-09-10 NOTE — Telephone Encounter (Signed)
-----   Message from Gae Dry, MD sent at 09/01/2019  7:56 AM EDT ----- Regarding: MMG Does she do this at [facility]? If so, when?  We have no copy. Received notice she has not received MMG yet as ordered at her Annual. Please check and encourage her to do this, and document conversation.

## 2019-09-10 NOTE — Telephone Encounter (Signed)
Left voice message to advise pt to get her mammo

## 2020-06-30 ENCOUNTER — Other Ambulatory Visit: Payer: Self-pay | Admitting: Obstetrics & Gynecology

## 2020-06-30 ENCOUNTER — Other Ambulatory Visit: Payer: Self-pay | Admitting: Emergency Medicine

## 2020-06-30 DIAGNOSIS — Z1231 Encounter for screening mammogram for malignant neoplasm of breast: Secondary | ICD-10-CM

## 2020-07-09 ENCOUNTER — Other Ambulatory Visit: Payer: Self-pay | Admitting: Obstetrics & Gynecology

## 2020-07-09 ENCOUNTER — Other Ambulatory Visit: Payer: Self-pay

## 2020-07-09 ENCOUNTER — Ambulatory Visit
Admission: RE | Admit: 2020-07-09 | Discharge: 2020-07-09 | Disposition: A | Discharge status: Home / Self Care | Payer: 59 | Source: Ambulatory Visit | Attending: Obstetrics & Gynecology | Admitting: Obstetrics & Gynecology

## 2020-07-09 DIAGNOSIS — Z1231 Encounter for screening mammogram for malignant neoplasm of breast: Secondary | ICD-10-CM

## 2021-06-27 ENCOUNTER — Encounter: Payer: Self-pay | Admitting: Obstetrics & Gynecology

## 2021-06-29 ENCOUNTER — Other Ambulatory Visit (HOSPITAL_COMMUNITY)
Admission: RE | Admit: 2021-06-29 | Discharge: 2021-06-29 | Disposition: A | Discharge status: Home / Self Care | Payer: 59 | Source: Ambulatory Visit | Attending: Obstetrics & Gynecology | Admitting: Obstetrics & Gynecology

## 2021-06-29 ENCOUNTER — Ambulatory Visit (INDEPENDENT_AMBULATORY_CARE_PROVIDER_SITE_OTHER): Payer: 59 | Admitting: Obstetrics & Gynecology

## 2021-06-29 ENCOUNTER — Encounter: Payer: Self-pay | Admitting: Obstetrics & Gynecology

## 2021-06-29 VITALS — BP 140/80 | Ht 64.0 in | Wt 116.0 lb

## 2021-06-29 DIAGNOSIS — Z01419 Encounter for gynecological examination (general) (routine) without abnormal findings: Secondary | ICD-10-CM

## 2021-06-29 DIAGNOSIS — Z124 Encounter for screening for malignant neoplasm of cervix: Secondary | ICD-10-CM | POA: Insufficient documentation

## 2021-06-29 DIAGNOSIS — Z30432 Encounter for removal of intrauterine contraceptive device: Secondary | ICD-10-CM

## 2021-06-29 MED ORDER — SYNTHROID 50 MCG PO TABS: 50.0000 ug | ORAL_TABLET | Freq: Every day | ORAL | 3 refills | Status: AC

## 2021-06-29 NOTE — Patient Instructions (Addendum)
This is the new practice information you requested: ? ?Dr. Clarisse [facility] ?[address] ?[address] ?[place], [place]  [address] ?[phone] ? ?PAP every three years ?Mammogram every year ?Labs yearly (with PCP) ? ?Thank you for choosing [facility]. As part of our ongoing efforts to improve patient experience, we would appreciate your feedback. Please fill out the short survey that you will receive by mail or MyChart. Your opinion is important to [place]! ?- Dr. Kenton Kingfisher ? ?

## 2021-06-29 NOTE — Progress Notes (Signed)
? ?HPI: ?     [name] is a 46 y.o. [id number] who LMP was No LMP recorded. (Menstrual status: IUD)., she presents today for her annual examination. The patient has no complaints today. Desires IUD out, see how hormones affect her.  Has occas periods now year 4 of Mirena.  ? ?The patient is sexually active. Her last pap: was normal and last mammogram: was normal. The patient does perform self breast exams.  There is no notable family history of breast or ovarian cancer in her family.  The patient has regular exercise: yes.  The patient denies current symptoms of depression.   ? ?GYN History: ?Contraception: IUD ? ?PMHx: ?Past Medical History:  ?Diagnosis Date  ? Fibroids   ? Hypertension   ? Hypothyroidism   ? LGSIL on Pap smear of cervix   ? Psoriasis   ? ?Past Surgical History:  ?Procedure Laterality Date  ? AUGMENTATION MAMMAPLASTY Bilateral   ? 15 years ago  ? COLONOSCOPY WITH PROPOFOL N/A 08/29/2017  ? Procedure: COLONOSCOPY WITH PROPOFOL;  Surgeon: Virgel Manifold, MD;  Location: [facility] ENDOSCOPY;  Service: Endoscopy;  Laterality: N/A;  ? COLPOSCOPY    ? LASIK    ? MYOMECTOMY    ? TONSILLECTOMY    ? ?Family History  ?Problem Relation Age of Onset  ? Arthritis Mother   ? Colon cancer Mother   ? Hypertension Mother   ? Skin cancer Mother   ? Alcoholism Father   ? Hypertension Father   ? Breast cancer Maternal Aunt   ? ?Social History  ? ?Tobacco Use  ? Smoking status: Never  ? Smokeless tobacco: Never  ?Vaping Use  ? Vaping Use: Never used  ?Substance Use Topics  ? Alcohol use: Yes  ? Drug use: Never  ? ? ?Current Outpatient Medications:  ?  levonorgestrel (MIRENA) 20 MCG/24HR IUD, 1 each by Intrauterine route once., Disp: , Rfl:  ?  OTEZLA 30 MG TABS, Take 1 tablet by mouth 2 (two) times daily., Disp: , Rfl: 11 ?  SYNTHROID 50 MCG tablet, Take 1 tablet (50 mcg total) by mouth daily before breakfast., Disp: 90 tablet, Rfl: 3 ?Allergies: Patient has no known allergies. ? ?Review of Systems   ?Constitutional:  Negative for chills, fever and malaise/fatigue.  ?HENT:  Negative for congestion, sinus pain and sore throat.   ?Eyes:  Negative for blurred vision and pain.  ?Respiratory:  Negative for cough and wheezing.   ?Cardiovascular:  Negative for chest pain and leg swelling.  ?Gastrointestinal:  Negative for abdominal pain, constipation, diarrhea, heartburn, nausea and vomiting.  ?Genitourinary:  Negative for dysuria, frequency, hematuria and urgency.  ?Musculoskeletal:  Negative for back pain, joint pain, myalgias and neck pain.  ?Skin:  Negative for itching and rash.  ?Neurological:  Negative for dizziness, tremors and weakness.  ?Endo/Heme/Allergies:  Does not bruise/bleed easily.  ?Psychiatric/Behavioral:  Negative for depression. The patient is not nervous/anxious and does not have insomnia.   ? ?Objective: ?BP 140/80   Ht '5\' 4"'$  (1.626 m)   Wt 116 lb (52.6 kg)   BMI 19.91 kg/m?   ?Filed Weights  ? 06/29/21 1448  ?Weight: 116 lb (52.6 kg)  ? Body mass index is 19.91 kg/m?Marland Kitchen ?Physical Exam ?Constitutional:   ?   General: She is not in acute distress. ?   Appearance: She is well-developed.  ?Genitourinary:  ?   Bladder, rectum and urethral meatus normal.  ?   No lesions in the vagina.  ?  Right Labia: No rash, tenderness or lesions. ?   Left Labia: No tenderness, lesions or rash. ?   No vaginal bleeding.  ? ?   Right Adnexa: not tender and no mass present. ?   Left Adnexa: not tender and no mass present. ?   No cervical motion tenderness, friability, lesion or polyp.  ?   IUD strings visualized.  ?   Uterus is not enlarged.  ?   No uterine mass detected. ?   Pelvic exam was performed with patient in the lithotomy position.  ?Breasts: ?   Right: No mass, skin change or tenderness.  ?   Left: No mass, skin change or tenderness.  ?HENT:  ?   Head: Normocephalic and atraumatic. No laceration.  ?   Right Ear: Hearing normal.  ?   Left Ear: Hearing normal.  ?   Mouth/Throat:  ?   Pharynx: Uvula midline.   ?Eyes:  ?   Pupils: Pupils are equal, round, and reactive to light.  ?Neck:  ?   Thyroid: No thyromegaly.  ?Cardiovascular:  ?   Rate and Rhythm: Normal rate and regular rhythm.  ?   Heart sounds: No murmur heard. ?  No friction rub. No gallop.  ?Pulmonary:  ?   Effort: Pulmonary effort is normal. No respiratory distress.  ?   Breath sounds: Normal breath sounds. No wheezing.  ?Abdominal:  ?   General: Bowel sounds are normal. There is no distension.  ?   Palpations: Abdomen is soft.  ?   Tenderness: There is no abdominal tenderness. There is no rebound.  ?Musculoskeletal:     ?   General: Normal range of motion.  ?   Cervical back: Normal range of motion and neck supple.  ?Neurological:  ?   Mental Status: She is alert and oriented to person, place, and time.  ?   Cranial Nerves: No cranial nerve deficit.  ?Skin: ?   General: Skin is warm and dry.  ?Psychiatric:     ?   Judgment: Judgment normal.  ?Vitals reviewed.  ? ? ?Assessment:  ANNUAL EXAM ?1. Women's annual routine gynecological examination   ?2. Screening for cervical cancer   ?3. Encounter for IUD removal   ? ? ? ?Screening Plan: ?           ?1.  Cervical Screening-  Pap smear done today ? ?2. Breast screening- Exam annually and mammogram>40 planned  ? ?3. Colonoscopy every 10 years, Hemoccult testing - after age 22 ? ?26. Labs managed by PCP ? ?5. Counseling for contraception:  desires IUD out and see how periods and hormones are for her.  Condoms planned for birth control.  She required meds for fertility in past, but this is not reliable contraception plan, and she is aware. ? ? ?6.  Encounter for IUD removal ? ?Pelvic exam:  ?Two IUD strings present seen coming from the cervical os. ?EGBUS, vaginal vault and cervix: within normal limits ? ?IUD Removal ?Strings of IUD identified and grasped.  IUD removed without problem.  Pt tolerated this well.  IUD noted to be intact. ? ?Assessment: IUD Removal ? ?Plan: ?IUD removed and plan for contraception is  condoms. ?She was amenable to this plan. ? ?  F/U ? Return for and Annual when due. ? ?Barnett Applebaum, MD, [place] ?[facility], [place] ?06/29/2021  3:20 PM ? ? ?

## 2021-07-01 LAB — CYTOLOGY - PAP
Comment: NEGATIVE
Diagnosis: NEGATIVE
High risk HPV: NEGATIVE

## 2022-03-20 IMAGING — MG DIGITAL SCREENING BREAST BILAT IMPLANT W/ TOMO W/ CAD
9 of 12 series · 9 of 28 positions shown · non-contrast
Comparison: Previous exam(s).

CLINICAL DATA: Screening.

EXAM:
DIGITAL SCREENING BILATERAL MAMMOGRAM WITH IMPLANTS, CAD AND
TOMOSYNTHESIS
TECHNIQUE: Bilateral screening digital craniocaudal and mediolateral oblique
mammograms were obtained. Bilateral screening digital breast
tomosynthesis was performed. The images were evaluated with
computer-aided detection. Standard and/or implant displaced views
were performed.

[L MLO]
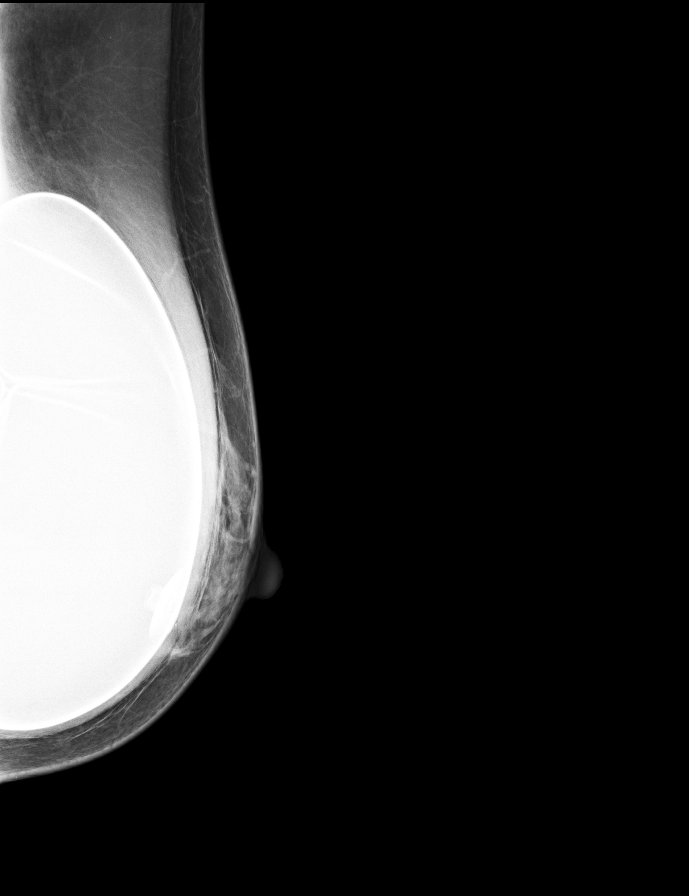

[R CC]
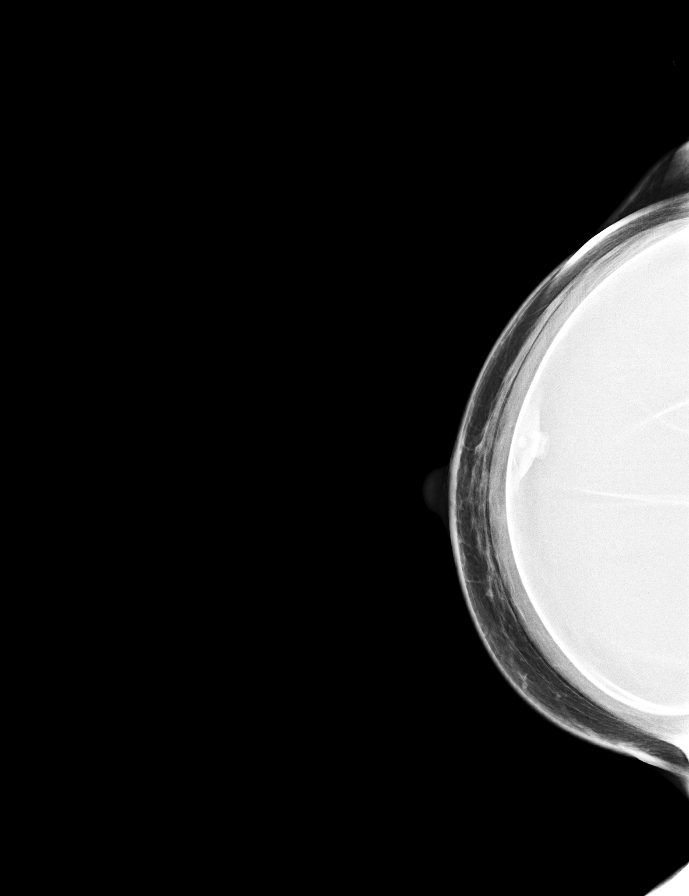

[R MLO]
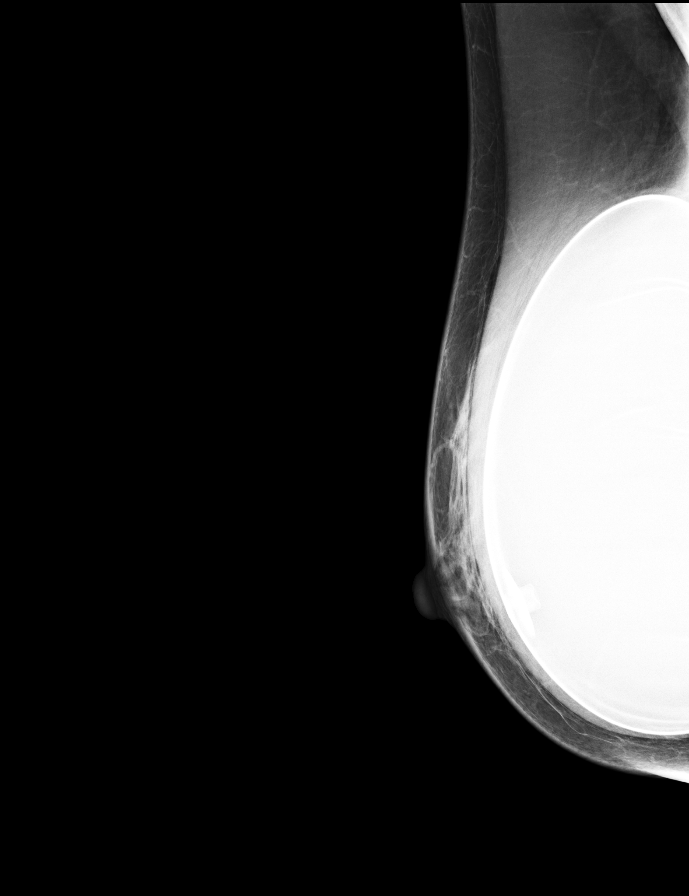

[L CC]
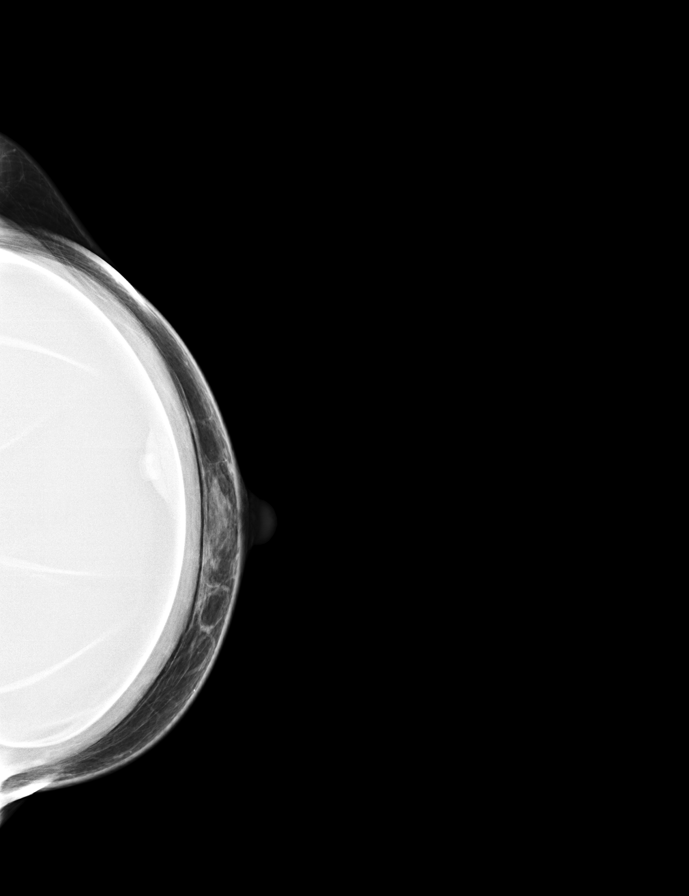

[L CC synth-2D]
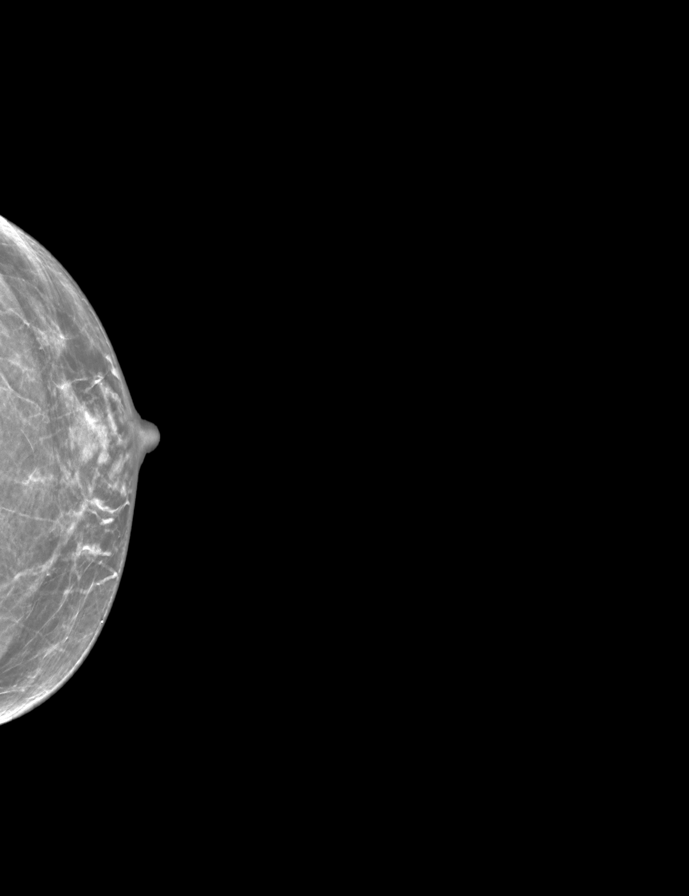

[R CC synth-2D]
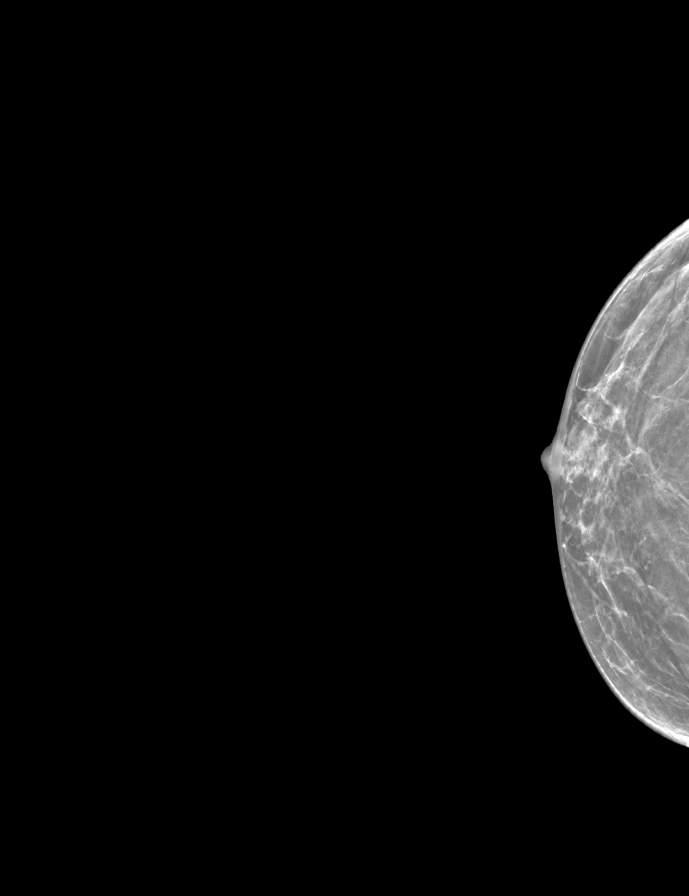

[L MLO synth-2D]
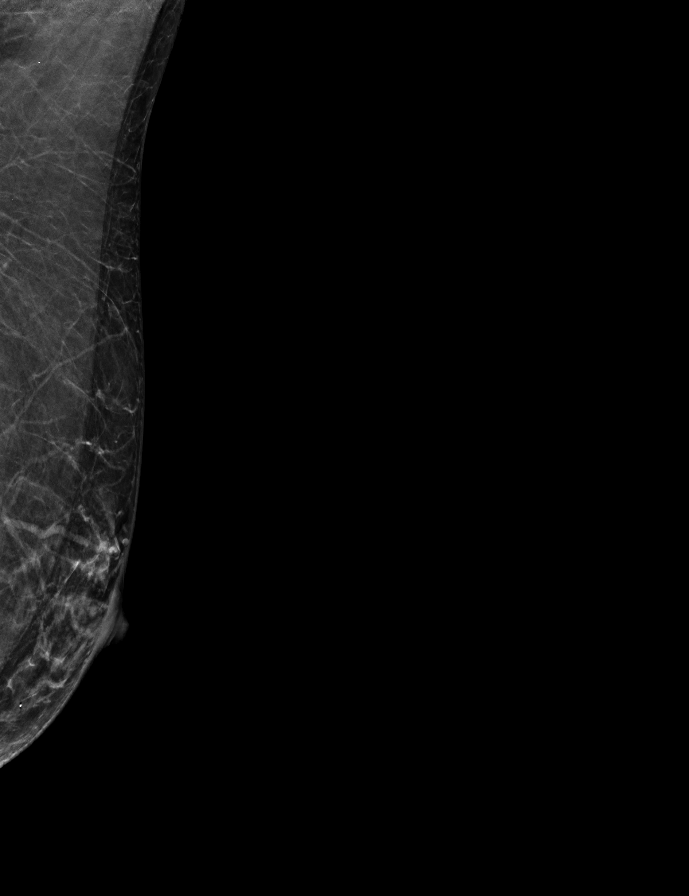

[R MLO synth-2D]
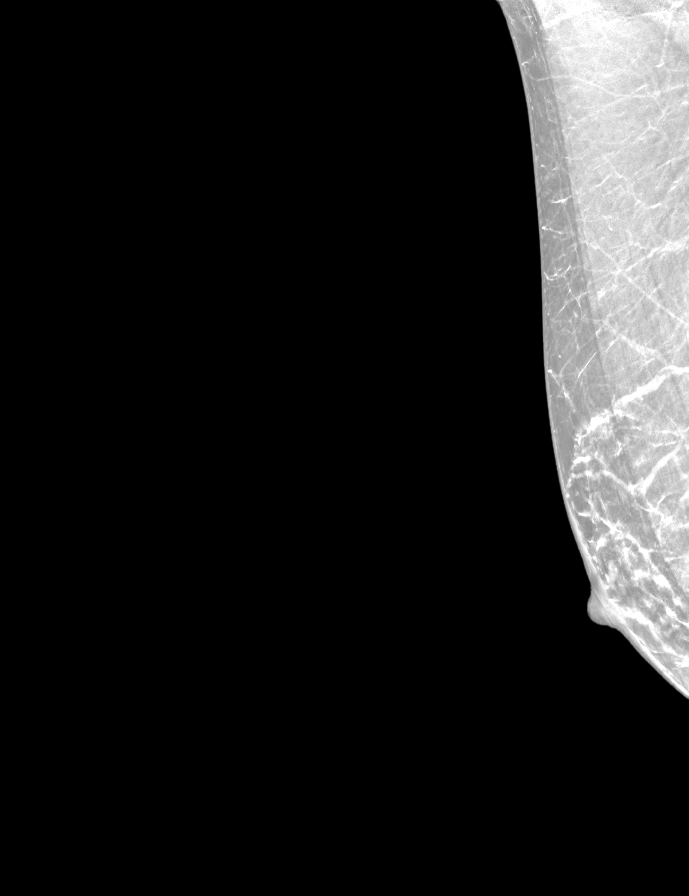

[L MLOID BREAST TOMOSYNTHESIS IMAGE tomo · tomo slice 19/36.0]
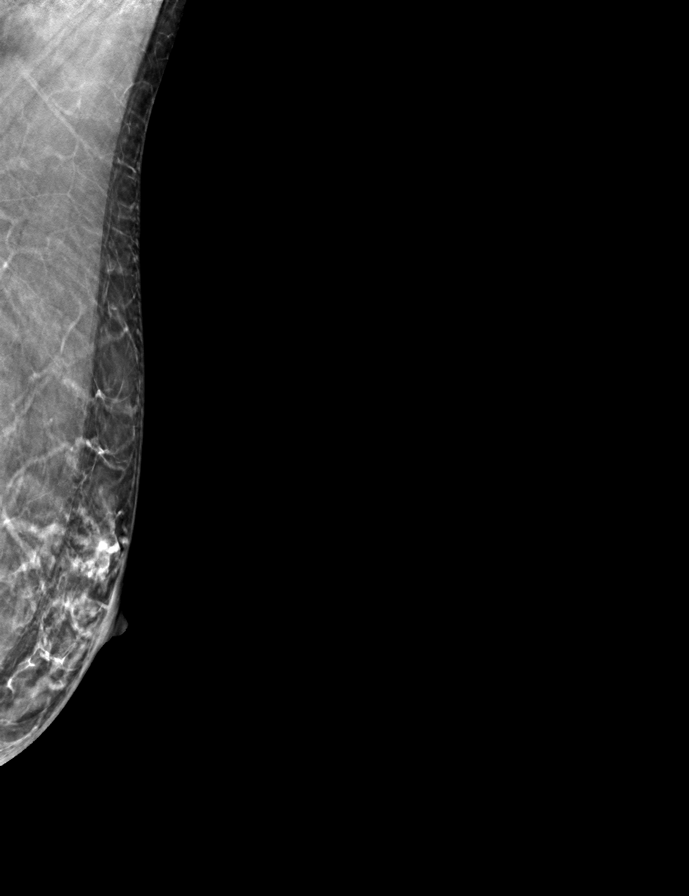

[9 of 28 positions shown; findings below may reference images not displayed]

ACR Breast Density Category b: There are scattered areas of
fibroglandular density.
FINDINGS: The patient has retropectoral implants. There are no findings
suspicious for malignancy.
IMPRESSION: No mammographic evidence of malignancy. A result letter of this
screening mammogram will be mailed directly to the patient.

RECOMMENDATION:
Screening mammogram in one year. (Code:SE-S-JMG)

BI-RADS CATEGORY  1:  Negative.

## 2023-03-30 ENCOUNTER — Telehealth: Payer: Self-pay

## 2023-03-30 NOTE — Telephone Encounter (Signed)
 Called pt to discuss the fax from her insurance company, pt had not been seen here since 2023, the letter states LEVOTHYROXINE-CONSIDER TSH MONITORING.I called pt left a message stating if she needed refill on her medication she needs to follow up with her pcp and have labs checked.
